# Patient Record
Sex: Male | Born: 1950 | Race: Black or African American | Hispanic: No | Marital: Married | Smoking: Never smoker
Health system: Southern US, Community
[De-identification: ages and names within clinical notes are randomized; demographics above are authoritative.]

## PROBLEM LIST (undated history)

## (undated) DIAGNOSIS — F419 Anxiety disorder, unspecified: Secondary | ICD-10-CM

## (undated) DIAGNOSIS — F4310 Post-traumatic stress disorder, unspecified: Secondary | ICD-10-CM

## (undated) DIAGNOSIS — K760 Fatty (change of) liver, not elsewhere classified: Secondary | ICD-10-CM

## (undated) DIAGNOSIS — E7800 Pure hypercholesterolemia, unspecified: Secondary | ICD-10-CM

## (undated) DIAGNOSIS — I10 Essential (primary) hypertension: Secondary | ICD-10-CM

## (undated) DIAGNOSIS — E119 Type 2 diabetes mellitus without complications: Secondary | ICD-10-CM

## (undated) HISTORY — PX: PROSTATE SURGERY: SHX751

## (undated) HISTORY — PX: HIP SURGERY: SHX245

## (undated) HISTORY — PX: HERNIA REPAIR: SHX51

## (undated) HISTORY — PX: ROTATOR CUFF REPAIR: SHX139

## (undated) HISTORY — PX: CERVICAL FUSION: SHX112

## (undated) HISTORY — PX: FOOT SURGERY: SHX648

---

## 2018-09-24 ENCOUNTER — Emergency Department (HOSPITAL_COMMUNITY): Payer: Medicare Other

## 2018-09-24 ENCOUNTER — Encounter (HOSPITAL_COMMUNITY): Payer: Self-pay | Admitting: Emergency Medicine

## 2018-09-24 ENCOUNTER — Other Ambulatory Visit: Payer: Self-pay

## 2018-09-24 ENCOUNTER — Emergency Department (HOSPITAL_COMMUNITY)
Admission: EM | Admit: 2018-09-24 | Discharge: 2018-09-24 | Disposition: A | Discharge status: Home / Self Care | Payer: Medicare Other | Attending: Emergency Medicine | Admitting: Emergency Medicine

## 2018-09-24 DIAGNOSIS — S0990XA Unspecified injury of head, initial encounter: Secondary | ICD-10-CM | POA: Diagnosis present

## 2018-09-24 DIAGNOSIS — Y939 Activity, unspecified: Secondary | ICD-10-CM | POA: Diagnosis not present

## 2018-09-24 DIAGNOSIS — Y929 Unspecified place or not applicable: Secondary | ICD-10-CM | POA: Insufficient documentation

## 2018-09-24 DIAGNOSIS — S301XXA Contusion of abdominal wall, initial encounter: Secondary | ICD-10-CM | POA: Insufficient documentation

## 2018-09-24 DIAGNOSIS — S8002XA Contusion of left knee, initial encounter: Secondary | ICD-10-CM | POA: Insufficient documentation

## 2018-09-24 DIAGNOSIS — Y9389 Activity, other specified: Secondary | ICD-10-CM | POA: Diagnosis not present

## 2018-09-24 DIAGNOSIS — Y998 Other external cause status: Secondary | ICD-10-CM | POA: Diagnosis not present

## 2018-09-24 DIAGNOSIS — S161XXA Strain of muscle, fascia and tendon at neck level, initial encounter: Secondary | ICD-10-CM | POA: Diagnosis not present

## 2018-09-24 DIAGNOSIS — R51 Headache: Secondary | ICD-10-CM | POA: Diagnosis not present

## 2018-09-24 DIAGNOSIS — Y999 Unspecified external cause status: Secondary | ICD-10-CM | POA: Insufficient documentation

## 2018-09-24 HISTORY — DX: Pure hypercholesterolemia, unspecified: E78.00

## 2018-09-24 HISTORY — DX: Anxiety disorder, unspecified: F41.9

## 2018-09-24 HISTORY — DX: Essential (primary) hypertension: I10

## 2018-09-24 HISTORY — DX: Fatty (change of) liver, not elsewhere classified: K76.0

## 2018-09-24 HISTORY — DX: Type 2 diabetes mellitus without complications: E11.9

## 2018-09-24 HISTORY — DX: Post-traumatic stress disorder, unspecified: F43.10

## 2018-09-24 LAB — CBC WITH DIFFERENTIAL/PLATELET
Abs Immature Granulocytes: 0.03 10*3/uL (ref 0.00–0.07)
Basophils Absolute: 0 10*3/uL (ref 0.0–0.1)
Basophils Relative: 0 %
Eosinophils Absolute: 0.1 10*3/uL (ref 0.0–0.5)
Eosinophils Relative: 1 %
HCT: 41 % (ref 39.0–52.0)
Hemoglobin: 13.5 g/dL (ref 13.0–17.0)
Immature Granulocytes: 0 %
Lymphocytes Relative: 27 %
Lymphs Abs: 2.5 10*3/uL (ref 0.7–4.0)
MCH: 27.7 pg (ref 26.0–34.0)
MCHC: 32.9 g/dL (ref 30.0–36.0)
MCV: 84 fL (ref 80.0–100.0)
Monocytes Absolute: 0.6 10*3/uL (ref 0.1–1.0)
Monocytes Relative: 7 %
Neutro Abs: 6 10*3/uL (ref 1.7–7.7)
Neutrophils Relative %: 65 %
Platelets: 215 10*3/uL (ref 150–400)
RBC: 4.88 MIL/uL (ref 4.22–5.81)
RDW: 15.4 % (ref 11.5–15.5)
WBC: 9.2 10*3/uL (ref 4.0–10.5)
nRBC: 0 % (ref 0.0–0.2)

## 2018-09-24 LAB — BASIC METABOLIC PANEL
Anion gap: 10 (ref 5–15)
BUN: 15 mg/dL (ref 8–23)
CO2: 23 mmol/L (ref 22–32)
Calcium: 9.4 mg/dL (ref 8.9–10.3)
Chloride: 103 mmol/L (ref 98–111)
Creatinine, Ser: 1.3 mg/dL — ABNORMAL HIGH (ref 0.61–1.24)
GFR calc Af Amer: >60 mL/min (ref >60)
GFR calc non Af Amer: 56 mL/min — ABNORMAL LOW (ref >60)
Glucose, Bld: 149 mg/dL — ABNORMAL HIGH (ref 70–99)
Potassium: 3.6 mmol/L (ref 3.5–5.1)
Sodium: 136 mmol/L (ref 135–145)

## 2018-09-24 MED ORDER — ONDANSETRON HCL 4 MG/2ML IJ SOLN
4.0000 mg | Freq: Once | INTRAMUSCULAR | Status: AC
  Administered 2018-09-24: 4 mg via INTRAVENOUS
  Filled 2018-09-24: qty 2

## 2018-09-24 MED ORDER — IOHEXOL 300 MG/ML  SOLN
100.0000 mL | Freq: Once | INTRAMUSCULAR | Status: AC | PRN
  Administered 2018-09-24: 100 mL via INTRAVENOUS

## 2018-09-24 MED ORDER — MORPHINE SULFATE (PF) 4 MG/ML IV SOLN
4.0000 mg | Freq: Once | INTRAVENOUS | Status: AC
  Administered 2018-09-24: 4 mg via INTRAVENOUS
  Filled 2018-09-24: qty 1

## 2018-09-24 NOTE — ED Notes (Signed)
To c-t and returend  Pain med given

## 2018-09-24 NOTE — Discharge Instructions (Addendum)
Apply ice to affected areas for 20 minutes every 2 hours while awake for the next 2 days.  Rest.  Take your previously prescribed pain medication as directed as needed for discomfort.  Follow-up with your primary doctor if symptoms are not improving in the next week.
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
## 2018-09-24 NOTE — ED Notes (Signed)
Pt back to x ray
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
